# Patient Record
Sex: Male | Born: 1995 | Race: White | Hispanic: No | Marital: Single | Smoking: Never smoker
Health system: Southern US, Community
[De-identification: ages and names within clinical notes are randomized; demographics above are authoritative.]

## PROBLEM LIST (undated history)

## (undated) DIAGNOSIS — J45909 Unspecified asthma, uncomplicated: Secondary | ICD-10-CM

---
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
## 2014-09-21 ENCOUNTER — Encounter (HOSPITAL_COMMUNITY): Payer: Self-pay | Admitting: *Deleted

## 2014-09-21 ENCOUNTER — Emergency Department (HOSPITAL_COMMUNITY)
Admission: EM | Admit: 2014-09-21 | Discharge: 2014-09-21 | Disposition: A | Discharge status: Home / Self Care | Payer: 59 | Attending: Emergency Medicine | Admitting: Emergency Medicine

## 2014-09-21 ENCOUNTER — Emergency Department (HOSPITAL_COMMUNITY): Payer: 59

## 2014-09-21 DIAGNOSIS — Y9289 Other specified places as the place of occurrence of the external cause: Secondary | ICD-10-CM | POA: Insufficient documentation

## 2014-09-21 DIAGNOSIS — Y990 Civilian activity done for income or pay: Secondary | ICD-10-CM | POA: Diagnosis not present

## 2014-09-21 DIAGNOSIS — W51XXXA Accidental striking against or bumped into by another person, initial encounter: Secondary | ICD-10-CM | POA: Diagnosis not present

## 2014-09-21 DIAGNOSIS — S3991XA Unspecified injury of abdomen, initial encounter: Secondary | ICD-10-CM | POA: Diagnosis present

## 2014-09-21 DIAGNOSIS — R1033 Periumbilical pain: Secondary | ICD-10-CM

## 2014-09-21 DIAGNOSIS — Y9389 Activity, other specified: Secondary | ICD-10-CM | POA: Insufficient documentation

## 2014-09-21 DIAGNOSIS — L989 Disorder of the skin and subcutaneous tissue, unspecified: Secondary | ICD-10-CM | POA: Insufficient documentation

## 2014-09-21 LAB — I-STAT CHEM 8, ED
BUN: 11 mg/dL (ref 6–23)
CHLORIDE: 103 meq/L (ref 96–112)
CREATININE: 0.8 mg/dL (ref 0.50–1.35)
Calcium, Ion: 1.21 mmol/L (ref 1.12–1.23)
GLUCOSE: 92 mg/dL (ref 70–99)
HCT: 43 % (ref 39.0–52.0)
Hemoglobin: 14.6 g/dL (ref 13.0–17.0)
POTASSIUM: 4 meq/L (ref 3.7–5.3)
SODIUM: 139 meq/L (ref 137–147)
TCO2: 27 mmol/L (ref 0–100)

## 2014-09-21 MED ORDER — IOHEXOL 300 MG/ML  SOLN
100.0000 mL | Freq: Once | INTRAMUSCULAR | Status: AC | PRN
  Administered 2014-09-21: 100 mL via INTRAVENOUS

## 2014-09-21 MED ORDER — IBUPROFEN 800 MG PO TABS: 800.0000 mg | ORAL_TABLET | Freq: Three times a day (TID) | ORAL | Status: AC

## 2014-09-21 MED ORDER — SODIUM CHLORIDE 0.9 % IV SOLN
1000.0000 mL | Freq: Once | INTRAVENOUS | Status: AC
  Administered 2014-09-21: 1000 mL via INTRAVENOUS

## 2014-09-21 MED ORDER — IOHEXOL 300 MG/ML  SOLN
25.0000 mL | Freq: Once | INTRAMUSCULAR | Status: AC | PRN
  Administered 2014-09-21: 25 mL via ORAL

## 2014-09-21 MED ORDER — ONDANSETRON HCL 4 MG/2ML IJ SOLN: 4.0000 mg | Freq: Once | INTRAMUSCULAR | Status: DC
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
## 2014-09-21 NOTE — ED Notes (Signed)
Recheck vitals, pt waiting with family, no distress, states on has pain with walking

## 2014-09-21 NOTE — Discharge Instructions (Signed)
As discussed, your evaluation today has been largely reassuring.  But, it is important that you monitor your condition carefully, and do not hesitate to return to the ED if you develop new, or concerning changes in your condition. ? ?Otherwise, please follow-up with your physician for appropriate ongoing care. ? ?
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
## 2017-06-11 ENCOUNTER — Emergency Department (HOSPITAL_COMMUNITY)
Admission: EM | Admit: 2017-06-11 | Discharge: 2017-06-11 | Disposition: A | Discharge status: Home / Self Care | Payer: 59 | Attending: Emergency Medicine | Admitting: Emergency Medicine

## 2017-06-11 ENCOUNTER — Encounter (HOSPITAL_COMMUNITY): Payer: Self-pay | Admitting: Emergency Medicine

## 2017-06-11 DIAGNOSIS — Z79899 Other long term (current) drug therapy: Secondary | ICD-10-CM | POA: Insufficient documentation

## 2017-06-11 DIAGNOSIS — J039 Acute tonsillitis, unspecified: Secondary | ICD-10-CM | POA: Insufficient documentation

## 2017-06-11 MED ORDER — AMOXICILLIN 500 MG PO CAPS: 500.0000 mg | ORAL_CAPSULE | Freq: Three times a day (TID) | ORAL | 0 refills | Status: DC

## 2017-06-11 NOTE — Discharge Instructions (Signed)
Drink plenty of fluids.  Ibuprofen every 6 hrs for fever or pain.  Try taking OTC decongestant as directed.  Return if needed

## 2017-06-11 NOTE — ED Triage Notes (Signed)
Patient complaining of sore throat since yesterday.  

## 2017-06-11 NOTE — ED Provider Notes (Signed)
[facility]-EMERGENCY DEPT Provider Note   CSN: [id number] Arrival date & time: 06/11/17  1622     History   Chief Complaint Chief Complaint  Patient presents with  . Sore Throat    HPI Michael Kaufman is a 21 y.o. male.  HPI  Michael Kaufman is a 21 y.o. male who presents to the Emergency Department complaining of sore throat for one day.  He describes mild pain with swallowing and swelling to the right tonsil with white patches to both tonsils.  He has tried OTC cold medications and throat spray without relief.  Mild nasal congestion.  Denies fever, neck pain or stiffness, cough and ear pain.  No known sick contacts, but works with the public.     History reviewed. No pertinent past medical history.  There are no active problems to display for this patient.   History reviewed. No pertinent surgical history.     Home Medications    Prior to Admission medications   Medication Sig Start Date End Date Taking? Authorizing Provider  acetaminophen (TYLENOL) 500 MG tablet Take 500 mg by mouth every 6 (six) hours as needed for mild pain or moderate pain.    [provider]    Family History History reviewed. No pertinent family history.  Social History Social History  Substance Use Topics  . Smoking status: Never Smoker  . Smokeless tobacco: Never Used  . Alcohol use Yes     Comment: occasionally     Allergies   Coconut flavor   Review of Systems Review of Systems  Constitutional: Negative for activity change, appetite change, chills and fever.  HENT: Positive for congestion and sore throat. Negative for ear pain, facial swelling, trouble swallowing and voice change.   Eyes: Negative for pain and visual disturbance.  Respiratory: Negative for cough and shortness of breath.   Cardiovascular: Negative for chest pain.  Gastrointestinal: Negative for abdominal pain, nausea and vomiting.  Musculoskeletal: Negative for arthralgias, neck pain and neck stiffness.    Skin: Negative for color change and rash.  Neurological: Negative for dizziness, facial asymmetry, speech difficulty, numbness and headaches.  Hematological: Negative for adenopathy.  All other systems reviewed and are negative.    Physical Exam Updated Vital Signs BP 136/67 (BP Location: Right Arm)   Pulse 86   Temp 98.7 F (37.1 C) (Oral)   Resp 18   Ht 6' (1.829 m)   Wt 118.8 kg (262 lb)   SpO2 98%   BMI 35.53 kg/m   Physical Exam  Constitutional: He is oriented to person, place, and time. He appears well-developed and well-nourished. No distress.  HENT:  Head: Normocephalic and atraumatic.  Right Ear: Tympanic membrane and ear canal normal.  Left Ear: Tympanic membrane and ear canal normal.  Mouth/Throat: Uvula is midline and mucous membranes are normal. No trismus in the jaw. No uvula swelling. Posterior oropharyngeal edema and posterior oropharyngeal erythema present. No tonsillar abscesses.  Neck: Normal range of motion. Neck supple.  Cardiovascular: Normal rate, regular rhythm and normal heart sounds.   Pulmonary/Chest: Effort normal and breath sounds normal.  Abdominal: There is no splenomegaly. There is no tenderness.  Musculoskeletal: Normal range of motion.  Lymphadenopathy:    He has no cervical adenopathy.  Neurological: He is alert and oriented to person, place, and time. He exhibits normal muscle tone. Coordination normal.  Skin: Skin is warm and dry.  Nursing note and vitals reviewed.    ED Treatments / Results  Labs (all  labs ordered are listed, but only abnormal results are displayed) Labs Reviewed - No data to display  EKG  EKG Interpretation None       Radiology No results found.  Procedures Procedures (including critical care time)  Medications Ordered in ED Medications - No data to display   Initial Impression / Assessment and Plan / ED Course  I have reviewed the triage vital signs and the nursing notes.  Pertinent labs &  imaging results that were available during my care of the patient were reviewed by me and considered in my medical decision making (see chart for details).     Airway patent.  Bilateral erythema and exudative tonsils. No concerning sx's for PTA.  Appears stable for d/c, return precautions discussed.  Final Clinical Impressions(s) / ED Diagnoses   Final diagnoses:  Tonsillitis    New Prescriptions New Prescriptions   No medications on file     Rosey Bath 06/13/17 2329    Linwood Dibbles, MD 06/14/17 [phone]

## 2017-09-01 ENCOUNTER — Emergency Department (HOSPITAL_COMMUNITY)
Admission: EM | Admit: 2017-09-01 | Discharge: 2017-09-01 | Disposition: A | Discharge status: Home / Self Care | Payer: Self-pay | Attending: Emergency Medicine | Admitting: Emergency Medicine

## 2017-09-01 ENCOUNTER — Encounter (HOSPITAL_COMMUNITY): Payer: Self-pay

## 2017-09-01 DIAGNOSIS — J029 Acute pharyngitis, unspecified: Secondary | ICD-10-CM | POA: Insufficient documentation

## 2017-09-01 DIAGNOSIS — R05 Cough: Secondary | ICD-10-CM | POA: Insufficient documentation

## 2017-09-01 DIAGNOSIS — R0981 Nasal congestion: Secondary | ICD-10-CM | POA: Insufficient documentation

## 2017-09-01 DIAGNOSIS — J069 Acute upper respiratory infection, unspecified: Secondary | ICD-10-CM | POA: Insufficient documentation

## 2017-09-01 DIAGNOSIS — R51 Headache: Secondary | ICD-10-CM | POA: Insufficient documentation

## 2017-09-01 MED ORDER — AMOXICILLIN 500 MG PO CAPS: 500.0000 mg | ORAL_CAPSULE | Freq: Three times a day (TID) | ORAL | 0 refills | Status: DC

## 2017-09-01 MED ORDER — AMOXICILLIN 250 MG PO CAPS
500.0000 mg | ORAL_CAPSULE | Freq: Once | ORAL | Status: AC
  Administered 2017-09-01: 500 mg via ORAL
  Filled 2017-09-01: qty 2

## 2017-09-01 MED ORDER — PSEUDOEPHEDRINE HCL 60 MG PO TABS
60.0000 mg | ORAL_TABLET | Freq: Once | ORAL | Status: AC
  Administered 2017-09-01: 60 mg via ORAL
  Filled 2017-09-01: qty 1

## 2017-09-01 MED ORDER — IBUPROFEN 800 MG PO TABS
800.0000 mg | ORAL_TABLET | Freq: Once | ORAL | Status: AC
  Administered 2017-09-01: 800 mg via ORAL
  Filled 2017-09-01: qty 1

## 2017-09-01 NOTE — ED Provider Notes (Signed)
[facility]GENCY DEPARTMENT Provider Note   CSN: [id number] Arrival date & time: 09/01/17  0747     History   Chief Complaint Chief Complaint  Patient presents with  . Sore Throat    HPI Michael Kaufman is a 21 y.o. male.   URI   This is a new problem. The current episode started more than 2 days ago. The problem has been gradually worsening. There has been no fever. Associated symptoms include congestion, headaches, sore throat and cough. Pertinent negatives include no chest pain, no abdominal pain, no vomiting, no dysuria, no neck pain, no rash and no wheezing. Treatments tried: tylenol. The treatment provided mild relief.    History reviewed. No pertinent past medical history.  There are no active problems to display for this patient.   History reviewed. No pertinent surgical history.     Home Medications    Prior to Admission medications   Medication Sig Start Date End Date Taking? Authorizing Provider  acetaminophen (TYLENOL) 500 MG tablet Take 500 mg by mouth every 6 (six) hours as needed for mild pain or moderate pain.    [provider]  amoxicillin (AMOXIL) 500 MG capsule Take 1 capsule (500 mg total) by mouth 3 (three) times daily. 06/11/17   Pauline Ausriplett, Tammy, PA-C    Family History No family history on file.  Social History Social History   Tobacco Use  . Smoking status: Never Smoker  . Smokeless tobacco: Never Used  Substance Use Topics  . Alcohol use: Yes    Comment: occasionally  . Drug use: No     Allergies   Coconut flavor   Review of Systems Review of Systems  Constitutional: Positive for chills. Negative for activity change.       All ROS Neg except as noted in HPI  HENT: Positive for congestion and sore throat. Negative for nosebleeds.   Eyes: Negative for photophobia and discharge.  Respiratory: Positive for cough. Negative for shortness of breath and wheezing.   Cardiovascular: Negative for chest pain and  palpitations.  Gastrointestinal: Negative for abdominal pain, blood in stool and vomiting.  Genitourinary: Negative for dysuria, frequency and hematuria.  Musculoskeletal: Negative for arthralgias, back pain and neck pain.  Skin: Negative.  Negative for rash.  Neurological: Positive for headaches. Negative for dizziness, seizures and speech difficulty.  Psychiatric/Behavioral: Negative for confusion and hallucinations.     Physical Exam Updated Vital Signs BP 127/77 (BP Location: Left Arm)   Pulse 66   Temp 98.1 F (36.7 C) (Oral)   Resp 18   Ht 6\' 3"  (1.905 m)   Wt 121.6 kg (268 lb)   SpO2 97%   BMI 33.50 kg/m   Physical Exam  Constitutional: He is oriented to person, place, and time. He appears well-developed and well-nourished.  Non-toxic appearance.  HENT:  Head: Normocephalic.  Right Ear: Tympanic membrane and external ear normal.  Left Ear: Tympanic membrane and external ear normal.  Mouth/Throat: Uvula is midline and mucous membranes are normal. Uvula swelling present. Oropharyngeal exudate and posterior oropharyngeal erythema present. No tonsillar abscesses.  Eyes: EOM and lids are normal. Pupils are equal, round, and reactive to light.  Neck: Normal range of motion. Neck supple. Carotid bruit is not present.  Cardiovascular: Normal rate, regular rhythm, normal heart sounds, intact distal pulses and normal pulses.  Pulmonary/Chest: Breath sounds normal. No respiratory distress.  Abdominal: Soft. Bowel sounds are normal. There is no tenderness. There is no guarding.  Musculoskeletal: Normal range  of motion.  Lymphadenopathy:       Head (right side): No submandibular adenopathy present.       Head (left side): No submandibular adenopathy present.    He has no cervical adenopathy.  Neurological: He is alert and oriented to person, place, and time. He has normal strength. No cranial nerve deficit or sensory deficit.  Skin: Skin is warm and dry. No rash noted.    Psychiatric: He has a normal mood and affect. His speech is normal.  Nursing note and vitals reviewed.    ED Treatments / Results  Labs (all labs ordered are listed, but only abnormal results are displayed) Labs Reviewed - No data to display  EKG  EKG Interpretation None       Radiology No results found.  Procedures Procedures (including critical care time)  Medications Ordered in ED Medications  ibuprofen (ADVIL,MOTRIN) tablet 800 mg (not administered)  amoxicillin (AMOXIL) capsule 500 mg (not administered)  pseudoephedrine (SUDAFED) tablet 60 mg (not administered)     Initial Impression / Assessment and Plan / ED Course  I have reviewed the triage vital signs and the nursing notes.  Pertinent labs & imaging results that were available during my care of the patient were reviewed by me and considered in my medical decision making (see chart for details).       Final Clinical Impressions(s) / ED Diagnoses MDM Vital signs within normal limits.  The patient complains of sore throat.  He is able to manage secretions, he is also able to swallow, but with some discomfort.  No evidence of any tonsillar abscess.  Airway is patent.  Speech is understandable.  The patient will be asked to use salt water gargles, use of Amoxil 3 times daily, and use Tylenol and/or ibuprofen for pain or fever.  Patient will follow up with his primary physician or return to the emergency department if any changes or problems.   Final diagnoses:  Pharyngitis, unspecified etiology  Upper respiratory tract infection, unspecified type    ED Discharge Orders        Ordered    amoxicillin (AMOXIL) 500 MG capsule  3 times daily     09/01/17 0840       Ivery QualeBryant, Tirso Laws, PA-C 09/01/17 [id number]    Donnetta Hutchingook, Brian, MD 09/02/17 2056

## 2017-09-01 NOTE — ED Triage Notes (Signed)
Pt co sore throat and intermittent fever x 2 days.

## 2017-09-01 NOTE — Discharge Instructions (Signed)
Please wash hands frequently.  Please increase fluids.  Salt water gargles may be helpful.  Use Amoxil 3 times daily with food.  Use Tylenol every 4 hours, or 600 mg of ibuprofen every 6 hours for fever or aching.  Please use your mask until symptoms have resolved.

## 2018-05-15 ENCOUNTER — Ambulatory Visit (HOSPITAL_COMMUNITY)
Admission: RE | Admit: 2018-05-15 | Discharge: 2018-05-15 | Disposition: A | Discharge status: Home / Self Care | Payer: BLUE CROSS/BLUE SHIELD | Source: Ambulatory Visit | Attending: Internal Medicine | Admitting: Internal Medicine

## 2018-05-15 ENCOUNTER — Other Ambulatory Visit (HOSPITAL_COMMUNITY): Payer: Self-pay | Admitting: Internal Medicine

## 2018-05-15 DIAGNOSIS — B9789 Other viral agents as the cause of diseases classified elsewhere: Secondary | ICD-10-CM

## 2018-05-15 DIAGNOSIS — J028 Acute pharyngitis due to other specified organisms: Secondary | ICD-10-CM

## 2018-05-15 DIAGNOSIS — J029 Acute pharyngitis, unspecified: Secondary | ICD-10-CM | POA: Insufficient documentation

## 2018-05-15 DIAGNOSIS — J351 Hypertrophy of tonsils: Secondary | ICD-10-CM | POA: Diagnosis not present

## 2019-03-04 IMAGING — DX DG NECK SOFT TISSUE
2 series · 2 of 2 positions shown · non-contrast
Comparison: None.

CLINICAL DATA: Sore throat

EXAM:
NECK SOFT TISSUES - 1+ VIEW

[neck lat]
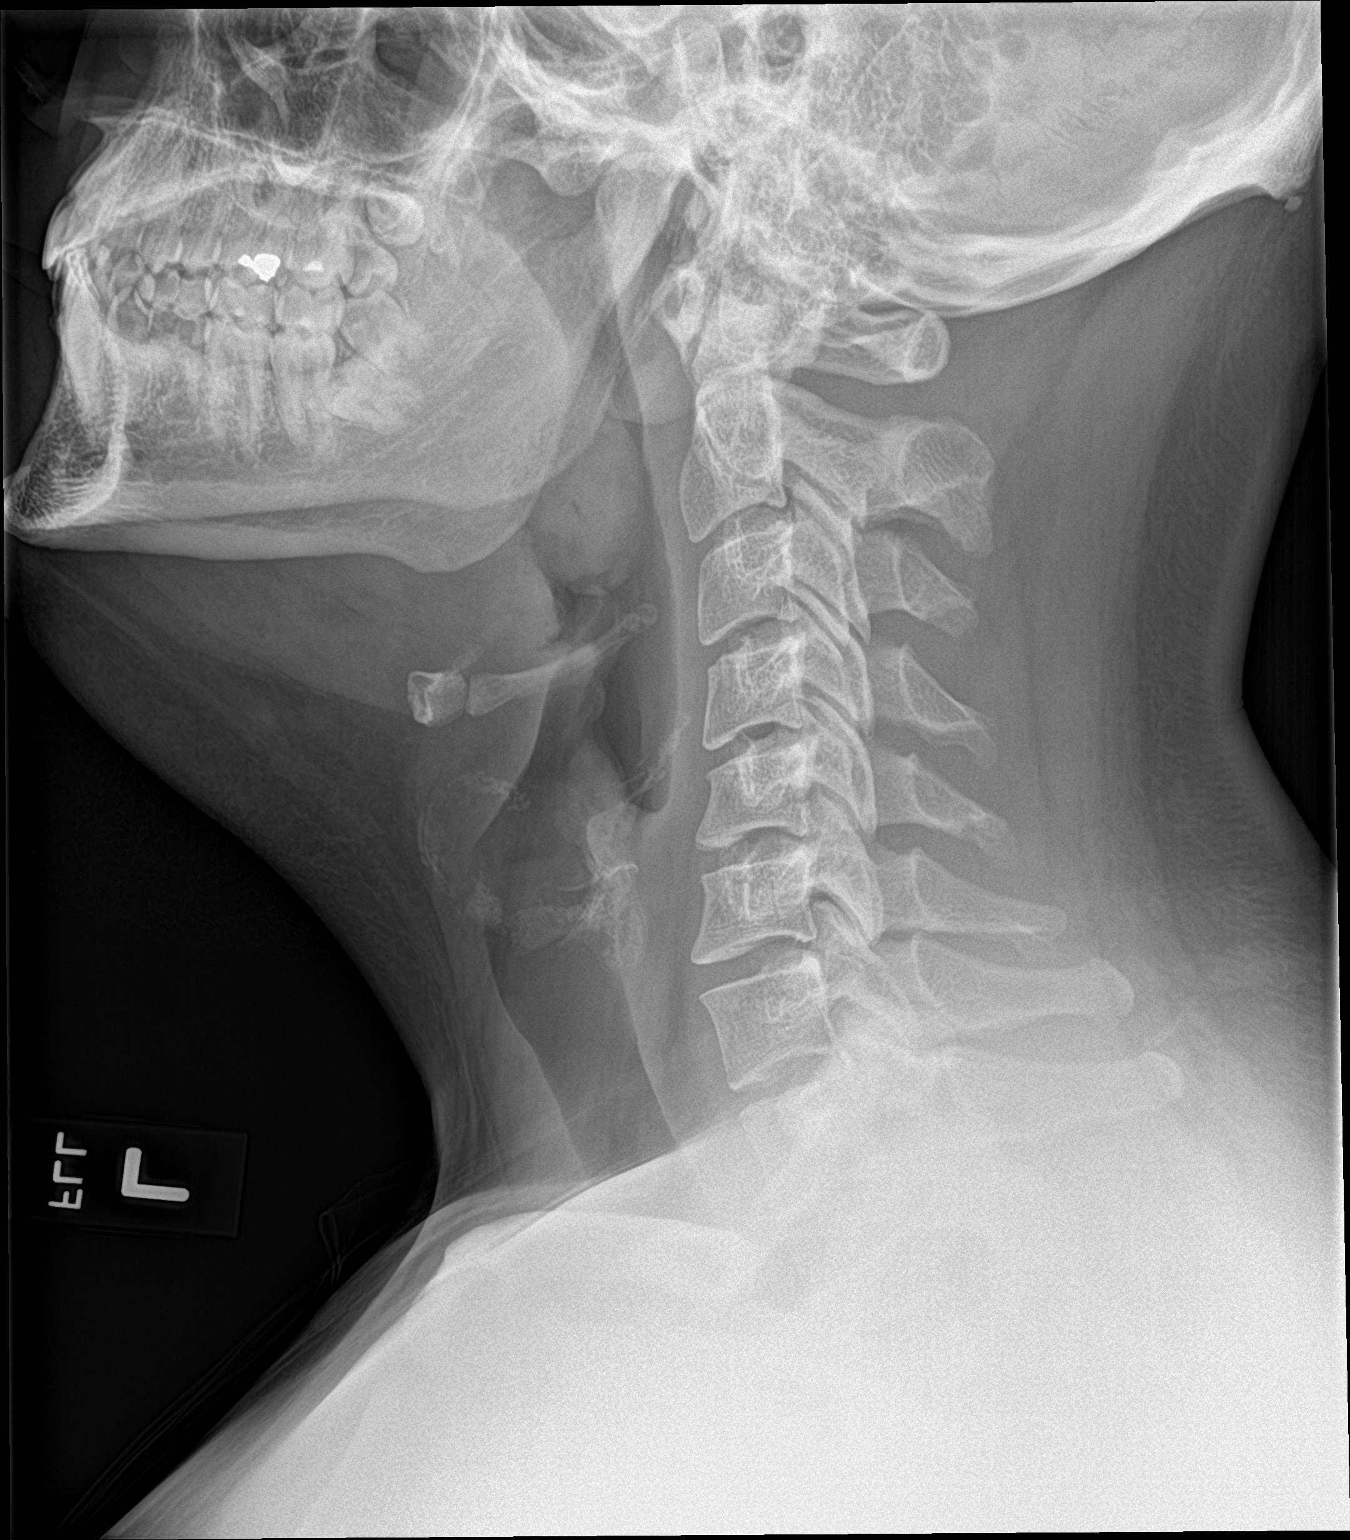

[neck ap]
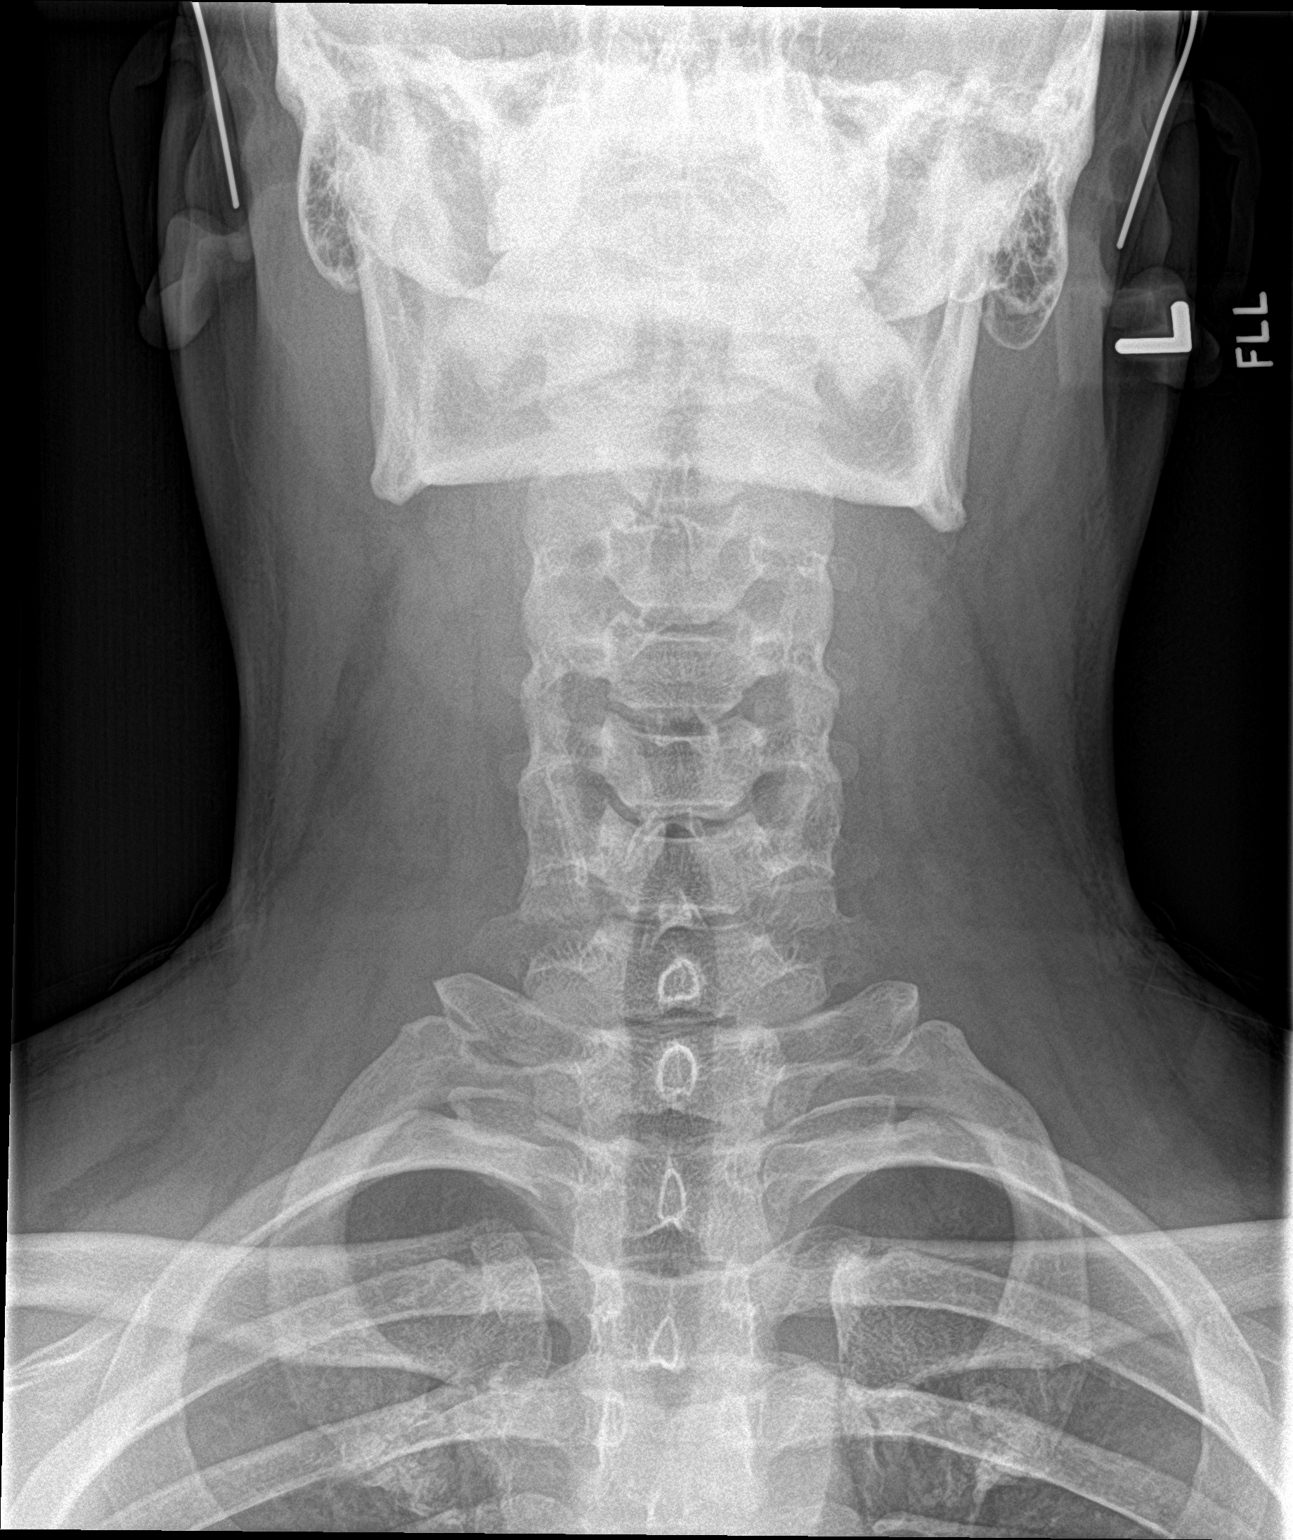

[2 of 2 positions shown; findings below may reference images not displayed]

FINDINGS: Frontal and lateral views obtained. The epiglottis and aryepiglottic
folds appear within normal limits. There is enlargement of the
lingual tonsils with impression on the pharyngeal air column in this
area. The adenoids appear normal.

There is no air-fluid level to suggest abscess. The prevertebral
soft tissues are normal. No evident bone lesion. Cervical trachea
appears unremarkable. Lung apices are clear.
IMPRESSION: Diffuse enlargement of the lingual tonsils.  Was unremarkable.

## 2019-08-17 NOTE — Progress Notes (Signed)
[facility] [place], [place] [address] Phone: [phone] Subjective:   Michael Kaufman, am serving as a scribe for Dr. Hulan Saas.   CC: Right shoulder pain follow-up  VPX:[id number]  Michael Kaufman is a 23 y.o. male coming in with complaint of right shoulder pain. Five years ago patient was holding pallet overhead. Twisted shoulder as he dropped pallet. Feels like his shoulder is unstable. Feels like he has had multiple other dislocations since injury 5 years ago. Unable to throw baseball. Has numbness in right shoulder but it does not radiate. Patient does not use anything for pain. 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      Kaufman past medical history on file. Kaufman past surgical history on file. Social History   Socioeconomic History  . Marital status: Single    Spouse name: Not on file  . Number of children: Not on file  . Years of education: Not on file  . Highest education level: Not on file  Occupational History  . Not on file  Social Needs  . Financial resource strain: Not on file  . Food insecurity    Worry: Not on file    Inability: Not on file  . Transportation needs    Medical: Not on file    Non-medical: Not on file  Tobacco Use  . Smoking status: Never Smoker  . Smokeless tobacco: Never Used  Substance and Sexual Activity  . Alcohol use: Yes    Comment: occasionally  . Drug use: Kaufman  . Sexual activity: Not on file  Lifestyle  . Physical activity    Days per week: Not on file    Minutes per session: Not on file  . Stress: Not on file  Relationships  . Social Herbalist on phone: Not on file    Gets together: Not on file    Attends religious service: Not on file    Active member of club or organization: Not on file    Attends meetings of clubs or organizations: Not on file    Relationship status: Not on file  Other Topics Concern  . Not on file  Social History  Narrative  . Not on file   Allergies  Allergen Reactions  . Coconut Flavor Swelling and Rash    Mouth swelling   Kaufman family history on file.     Current Outpatient Medications (Analgesics):  .  acetaminophen (TYLENOL) 500 MG tablet, Take 500 mg by mouth every 6 (six) hours as needed for mild pain or moderate pain.   Current Outpatient Medications (Other):  .  amoxicillin (AMOXIL) 500 MG capsule, Take 1 capsule (500 mg total) 3 (three) times daily by mouth. .  Diclofenac Sodium (PENNSAID) 2 % SOLN, Place 2 g onto the skin 2 (two) times daily. .  Vitamin D, Ergocalciferol, (DRISDOL) 1.25 MG (50000 UT) CAPS capsule, Take 1 capsule (50,000 Units total) by mouth every 7 (seven) days.    Past medical history, social, surgical and family history all reviewed in electronic medical record.  Kaufman pertanent information unless stated regarding to the chief complaint.   Review of Systems:  Kaufman headache, visual changes, nausea, vomiting, diarrhea, constipation, dizziness, abdominal pain, skin rash, fevers, chills, night sweats, weight loss, swollen lymph nodes, body aches, joint swelling,  chest pain, shortness of breath, mood changes.  Positive muscle aches  Objective  Blood pressure 118/72,  pulse 60, height 6\' 3"  (1.905 m), weight 249 lb (112.9 kg), SpO2 98 %.    General: Kaufman apparent distress alert and oriented x3 mood and affect normal, dressed appropriately.  HEENT: Pupils equal, extraocular movements intact  Respiratory: Patient's speak in full sentences and does not appear short of breath  Cardiovascular: Kaufman lower extremity edema, non tender, Kaufman erythema  Skin: Warm dry intact with Kaufman signs of infection or rash on extremities or on axial skeleton.  Abdomen: Soft nontender  Neuro: Cranial nerves II through XII are intact, neurovascularly intact in all extremities with 2+ DTRs and 2+ pulses.  Lymph: Kaufman lymphadenopathy of posterior or anterior cervical chain or axillae bilaterally.  Gait  normal with good balance and coordination.  MSK:  Non tender with full range of motion and good stability and symmetric strength and tone of  elbows, wrist, hip, knee and ankles bilaterally.  Shoulder: Right Inspection reveals Kaufman abnormalities, atrophy or asymmetry. Palpation is normal with Kaufman tenderness over AC joint or bicipital groove. ROM is full in all planes passively.  Patient does have some laxity with positive apprehension. Rotator cuff strength normal throughout. signs of impingement with positive Neer and Hawkin's tests, but negative empty can sign. Speeds and Yergason's tests normal. Kaufman labral pathology noted with negative Obrien's, positive clunk and instability noted.  Normal scapular function observed.. Kaufman apprehension sign  MSK performed of: Right This study was ordered, performed, and interpreted by [place] D.O.  Shoulder:   Supraspinatus:  Appears normal on long and transverse views, Subscapularis:  Appears normal on long and transverse views. AC joint:  Capsule undistended, Kaufman geyser sign. Glenohumeral Joint:  Appears normal without effusion. Glenoid Labrum:  Intact without visualized tears. Biceps Tendon:  Appears normal on long and transverse views, Kaufman fraying of tendon, tendon located in intertubercular groove, Kaufman subluxation with shoulder internal or external rotation.  Impression: Normal  Terrilee Files; 15 additional minutes spent for Therapeutic exercises as stated in above notes.  This included exercises focusing on stretching, strengthening, with significant focus on eccentric aspects.   Long term goals include an improvement in range of motion, strength, endurance as well as avoiding reinjury. Patient's frequency would include in 1-2 times a day, 3-5 times a week for a duration of 6-12 weeks. Shoulder Exercises that included:  Basic scapular stabilization to include adduction and depression of scapula Scaption, focusing on proper movement and good control  Internal and External rotation utilizing a theraband, with elbow tucked at side entire time Rows with theraband  Which was given    Proper technique shown and discussed handout in great detail with ATC.  All questions were discussed and answered.     Impression and Recommendations:     This case required medical decision making of moderate complexity. The above documentation has been reviewed and is accurate and complete [id number], DO       Note: This dictation was prepared with Dragon dictation along with smaller phrase technology. Any transcriptional errors that result from this process are unintentional.

## 2019-08-18 ENCOUNTER — Ambulatory Visit: Payer: Self-pay

## 2019-08-18 ENCOUNTER — Encounter: Payer: Self-pay | Admitting: Family Medicine

## 2019-08-18 ENCOUNTER — Ambulatory Visit: Payer: BLUE CROSS/BLUE SHIELD | Admitting: Family Medicine

## 2019-08-18 ENCOUNTER — Other Ambulatory Visit: Payer: Self-pay

## 2019-08-18 VITALS — BP 118/72 | HR 60 | Ht 75.0 in | Wt 249.0 lb

## 2019-08-18 DIAGNOSIS — M25511 Pain in right shoulder: Secondary | ICD-10-CM

## 2019-08-18 DIAGNOSIS — M25311 Other instability, right shoulder: Secondary | ICD-10-CM | POA: Diagnosis not present

## 2019-08-18 MED ORDER — VITAMIN D (ERGOCALCIFEROL) 1.25 MG (50000 UNIT) PO CAPS: 50000.0000 [IU] | ORAL_CAPSULE | ORAL | 0 refills | Status: DC

## 2019-08-18 MED ORDER — PENNSAID 2 % TD SOLN: 2.0000 g | Freq: Two times a day (BID) | TRANSDERMAL | 3 refills | Status: DC

## 2019-08-18 NOTE — Patient Instructions (Signed)
Xray downstairs Exercise 3 times a week See me again in 6-8 weeks

## 2019-08-19 ENCOUNTER — Encounter: Payer: Self-pay | Admitting: Family Medicine

## 2019-08-19 DIAGNOSIS — M25311 Other instability, right shoulder: Secondary | ICD-10-CM | POA: Insufficient documentation

## 2019-08-19 NOTE — Assessment & Plan Note (Signed)
Multidirectional instability of the shoulder.  We discussed with patient's age that he may be a surgical candidate.  Patient wants to try conservative therapy.  Home exercises for stability given.  Discussed over-the-counter medications, discussed proper lifting mechanics, x-rays ordered today to further evaluate but ultrasound today showed that rotator cuff is intact.  Follow-up with me again in 4 to 6 weeks

## 2019-08-20 ENCOUNTER — Other Ambulatory Visit: Payer: Self-pay | Admitting: General Practice

## 2019-08-20 NOTE — Telephone Encounter (Signed)
Requested medication (s) are due for refill today: no  Requested medication (s) are on the active medication list: yes  Last refill:  09/09/2017  Future visit scheduled: yes  Notes to clinic:  Medication last filled by historical provider Diclofenac sodium sent to wrong pharmacy   Requested Prescriptions  Pending Prescriptions Disp Refills   amoxicillin (AMOXIL) 500 MG capsule 21 capsule 0    Sig: Take 1 capsule (500 mg total) by mouth 3 (three) times daily.     Off-Protocol Failed - 08/20/2019  1:08 PM      Failed - Medication not assigned to a protocol, review manually.      Passed - Valid encounter within last 12 months    Recent Outpatient Visits          2 days ago Right shoulder pain, unspecified chronicity   [place], [place], DO      Future Appointments            In 1 month [place], [facility]            Diclofenac Sodium (PENNSAID) 2 % SOLN 112 g 3    Sig: Place 2 g onto the skin 2 (two) times daily.     Analgesics:  Topicals Passed - 08/20/2019  1:08 PM      Passed - Valid encounter within last 12 months    Recent Outpatient Visits          2 days ago Right shoulder pain, unspecified chronicity   [place], [place], DO      Future Appointments            In 1 month Tamala Julian Olevia [place], [place], [facility]

## 2019-08-20 NOTE — Telephone Encounter (Signed)
Patient is a patient of

## 2019-08-20 NOTE — Telephone Encounter (Signed)
Routing to Sports Med. °

## 2019-08-20 NOTE — Telephone Encounter (Signed)
Medication Refill - Medication: amoxicillin (AMOXIL) 500 MG capsule [[id number]]  Diclofenac Sodium (PENNSAID) 2 % SOLN [[id number]]      Preferred Pharmacy (with phone number or street name):  [place], [place] - [address] [place] [id number]  [address] [place] [address]  Phone: [phone] Fax: [phone]     Agent: Please be advised that RX refills may take up to 3 business days. We ask that you follow-up with your pharmacy.

## 2019-09-04 DIAGNOSIS — E6609 Other obesity due to excess calories: Secondary | ICD-10-CM | POA: Diagnosis not present

## 2019-09-04 DIAGNOSIS — K146 Glossodynia: Secondary | ICD-10-CM | POA: Diagnosis not present

## 2019-09-04 DIAGNOSIS — Z6832 Body mass index (BMI) 32.0-32.9, adult: Secondary | ICD-10-CM | POA: Diagnosis not present

## 2019-09-16 DIAGNOSIS — E6609 Other obesity due to excess calories: Secondary | ICD-10-CM | POA: Diagnosis not present

## 2019-09-16 DIAGNOSIS — Z6832 Body mass index (BMI) 32.0-32.9, adult: Secondary | ICD-10-CM | POA: Diagnosis not present

## 2019-09-16 DIAGNOSIS — R109 Unspecified abdominal pain: Secondary | ICD-10-CM | POA: Diagnosis not present

## 2019-09-16 DIAGNOSIS — R1012 Left upper quadrant pain: Secondary | ICD-10-CM | POA: Diagnosis not present

## 2019-10-01 ENCOUNTER — Ambulatory Visit: Payer: BC Managed Care – PPO | Admitting: Family Medicine

## 2019-10-01 NOTE — Progress Notes (Deleted)
[name] [address] [place], [place] [address] Phone: [phone] Subjective:    I'm seeing this patient by the request  of:    CC:   [id number]   08/18/2019 Multidirectional instability of the shoulder.  We discussed with patient's age that he may be a surgical candidate.  Patient wants to try conservative therapy.  Home exercises for stability given.  Discussed over-the-counter medications, discussed proper lifting mechanics, x-rays ordered today to further evaluate but ultrasound today showed that rotator cuff is intact.  Follow-up with me again in 4 to 6 weeks  Update 10/01/2019 Michael Kaufman is a 23 y.o. male coming in with complaint of right shoulder pain. Patient states      No past medical history on file. No past surgical history on file. Social History   Socioeconomic History  . Marital status: Single    Spouse name: Not on file  . Number of children: Not on file  . Years of education: Not on file  . Highest education level: Not on file  Occupational History  . Not on file  Social Needs  . Financial resource strain: Not on file  . Food insecurity    Worry: Not on file    Inability: Not on file  . Transportation needs    Medical: Not on file    Non-medical: Not on file  Tobacco Use  . Smoking status: Never Smoker  . Smokeless tobacco: Never Used  Substance and Sexual Activity  . Alcohol use: Yes    Comment: occasionally  . Drug use: No  . Sexual activity: Not on file  Lifestyle  . Physical activity    Days per week: Not on file    Minutes per session: Not on file  . Stress: Not on file  Relationships  . Social Musician on phone: Not on file    Gets together: Not on file    Attends religious service: Not on file    Active member of club or organization: Not on file    Attends meetings of clubs or organizations: Not on file    Relationship status: Not on file  Other Topics Concern  . Not on file   Social History Narrative  . Not on file   Allergies  Allergen Reactions  . Coconut Flavor Swelling and Rash    Mouth swelling   No family history on file.     Current Outpatient Medications (Analgesics):  .  acetaminophen (TYLENOL) 500 MG tablet, Take 500 mg by mouth every 6 (six) hours as needed for mild pain or moderate pain.   Current Outpatient Medications (Other):  .  amoxicillin (AMOXIL) 500 MG capsule, Take 1 capsule (500 mg total) 3 (three) times daily by mouth. .  Diclofenac Sodium (PENNSAID) 2 % SOLN, Place 2 g onto the skin 2 (two) times daily. .  Vitamin D, Ergocalciferol, (DRISDOL) 1.25 MG (50000 UT) CAPS capsule, Take 1 capsule (50,000 Units total) by mouth every 7 (seven) days.    Past medical history, social, surgical and family history all reviewed in electronic medical record.  No pertanent information unless stated regarding to the chief complaint.   Review of Systems:  No headache, visual changes, nausea, vomiting, diarrhea, constipation, dizziness, abdominal pain, skin rash, fevers, chills, night sweats, weight loss, swollen lymph nodes, body aches, joint swelling, muscle aches, chest pain, shortness of breath, mood changes.   Objective  There were no vitals taken for this  visit. Systems examined below as of    General: No apparent distress alert and oriented x3 mood and affect normal, dressed appropriately.  HEENT: Pupils equal, extraocular movements intact  Respiratory: Patient's speak in full sentences and does not appear short of breath  Cardiovascular: No lower extremity edema, non tender, no erythema  Skin: Warm dry intact with no signs of infection or rash on extremities or on axial skeleton.  Abdomen: Soft nontender  Neuro: Cranial nerves II through XII are intact, neurovascularly intact in all extremities with 2+ DTRs and 2+ pulses.  Lymph: No lymphadenopathy of posterior or anterior cervical chain or axillae bilaterally.  Gait normal with good  balance and coordination.  MSK:  Non tender with full range of motion and good stability and symmetric strength and tone of shoulders, elbows, wrist, hip, knee and ankles bilaterally.     Impression and Recommendations:     This case required medical decision making of moderate complexity. The above documentation has been reviewed and is accurate and complete Jacqualin Combes       Note: This dictation was prepared with Dragon dictation along with smaller phrase technology. Any transcriptional errors that result from this process are unintentional.

## 2019-11-29 ENCOUNTER — Other Ambulatory Visit: Payer: Self-pay

## 2019-11-29 ENCOUNTER — Ambulatory Visit
Admission: EM | Admit: 2019-11-29 | Discharge: 2019-11-29 | Disposition: A | Discharge status: Home / Self Care | Payer: BC Managed Care – PPO | Attending: Emergency Medicine | Admitting: Emergency Medicine

## 2019-11-29 DIAGNOSIS — N200 Calculus of kidney: Secondary | ICD-10-CM

## 2019-11-29 DIAGNOSIS — R1031 Right lower quadrant pain: Secondary | ICD-10-CM

## 2019-11-29 DIAGNOSIS — R109 Unspecified abdominal pain: Secondary | ICD-10-CM | POA: Diagnosis not present

## 2019-11-29 DIAGNOSIS — R10A1 Flank pain, right side: Secondary | ICD-10-CM

## 2019-11-29 MED ORDER — IBUPROFEN 800 MG PO TABS: 800.0000 mg | ORAL_TABLET | Freq: Three times a day (TID) | ORAL | 0 refills | Status: DC

## 2019-11-29 NOTE — ED Triage Notes (Signed)
Pt has right sided flank and right side abdominal pain and back pain that began 2 days ago

## 2019-11-29 NOTE — Discharge Instructions (Signed)
Unable to produce urine sample Will treat for possible kidney stones, however, you may have already passed it Push fluids and get plenty of rest.   Ibuprofen 800 mg prescribed.  Take as needed for pain Follow up with PCP as needed Return here or go to ER if you have any new or worsening symptoms such as fever, worsening abdominal pain, nausea/vomiting, flank pain, etc..Marland Kitchen

## 2019-11-29 NOTE — ED Provider Notes (Signed)
[facility]   CC: RT sided flank pain  SUBJECTIVE:  Michael Kaufman is a 24 y.o. male who complains of mostly resolved RLQ and flank discomfort for the past 2 days.  States he urinated yesterday and had what looked like "sand" in his urine.  Since then his symptoms have improved. Patient denies a precipitating event, or injury.  Does have a strenuous job, but denies known injury.  Denies concern for STDs.  Currently engaged and will be getting married in April of this year.  Localizes the discomfort to the RLQ and radiates into RT flank.  Pain is intermittent, and unable to characterize.  Has NOT tried OTC medications.  Symptoms are made worse to deep palpation.  Denies similar symptoms in the past. Does report dark urine yesterday, resolved today. Denies fever, chills, nausea, vomiting, chest pain, SOB, urethral discharge, testicular pain, testicular swelling.    LMP: No LMP for male patient.  ROS: As in HPI.  All other pertinent ROS negative.     History reviewed. No pertinent past medical history. History reviewed. No pertinent surgical history. No Known Allergies No current facility-administered medications on file prior to encounter.   No current outpatient medications on file prior to encounter.   Social History   Socioeconomic History  . Marital status: Single    Spouse name: Not on file  . Number of children: Not on file  . Years of education: Not on file  . Highest education level: Not on file  Occupational History  . Not on file  Tobacco Use  . Smoking status: Never Smoker  . Smokeless tobacco: Never Used  Substance and Sexual Activity  . Alcohol use: Yes    Comment: occasionally  . Drug use: No  . Sexual activity: Not on file  Other Topics Concern  . Not on file  Social History Narrative  . Not on file   Social Determinants of Health   Financial Resource Strain:   . Difficulty of Paying Living Expenses: Not on file  Food Insecurity:   . Worried  About Programme researcher, broadcasting/film/video in the Last Year: Not on file  . Ran Out of Food in the Last Year: Not on file  Transportation Needs:   . Lack of Transportation (Medical): Not on file  . Lack of Transportation (Non-Medical): Not on file  Physical Activity:   . Days of Exercise per Week: Not on file  . Minutes of Exercise per Session: Not on file  Stress:   . Feeling of Stress : Not on file  Social Connections:   . Frequency of Communication with Friends and Family: Not on file  . Frequency of Social Gatherings with Friends and Family: Not on file  . Attends Religious Services: Not on file  . Active Member of Clubs or Organizations: Not on file  . Attends Banker Meetings: Not on file  . Marital Status: Not on file  Intimate Partner Violence:   . Fear of Current or Ex-Partner: Not on file  . Emotionally Abused: Not on file  . Physically Abused: Not on file  . Sexually Abused: Not on file   Family History  Problem Relation Age of Onset  . Heart disease Mother   . Cancer Mother     OBJECTIVE:  Vitals:   11/29/19 0958  BP: 117/79  Pulse: (!) 55  Resp: 18  Temp: 98.1 F (36.7 C)  SpO2: 97%   General appearance: Alert in no acute distress HEENT:  NCAT.  PERRL, EOMI grossly; Oropharynx clear.  Lungs: clear to auscultation bilaterally without adventitious breath sounds Heart: regular rate and rhythm.   Abdomen: soft; non-distended; diffusely TTP over RLQ with deep palpation, difficult to reproduce symptoms on exam; bowel sounds present; no guarding or rebound tenderness Back: no CVA tenderness; no midline tenderness Extremities: no edema; symmetrical with no gross deformities Skin: warm and dry Neurologic: Ambulates from chair to exam table without difficulty Psychological: alert and cooperative; normal mood and affect  ASSESSMENT & PLAN:  1. Right flank discomfort   2. RLQ discomfort   3. Kidney stones     Meds ordered this encounter  Medications  .  ibuprofen (ADVIL) 800 MG tablet    Sig: Take 1 tablet (800 mg total) by mouth 3 (three) times daily.    Dispense:  30 tablet    Refill:  0    Order Specific Question:   Supervising Provider    Answer:   Raylene Everts [[id number]]   Unable to produce urine sample Will treat for possible kidney stones, however, you may have already passed it Push fluids and get plenty of rest.   Ibuprofen 800 mg prescribed.  Take as needed for pain Follow up with PCP as needed Return here or go to ER if you have any new or worsening symptoms such as fever, worsening abdominal pain, nausea/vomiting, flank pain, etc...  Outlined signs and symptoms indicating need for more acute intervention. Patient verbalized understanding. After Visit Summary given.     Lestine Box, PA-C 11/29/19 1036

## 2020-02-09 ENCOUNTER — Other Ambulatory Visit: Payer: Self-pay

## 2020-02-09 ENCOUNTER — Ambulatory Visit: Payer: BC Managed Care – PPO | Attending: Internal Medicine

## 2020-02-09 DIAGNOSIS — Z20822 Contact with and (suspected) exposure to covid-19: Secondary | ICD-10-CM | POA: Diagnosis not present

## 2020-02-10 LAB — NOVEL CORONAVIRUS, NAA: SARS-CoV-2, NAA: NOT DETECTED

## 2020-02-10 LAB — SARS-COV-2, NAA 2 DAY TAT

## 2020-02-11 DIAGNOSIS — R509 Fever, unspecified: Secondary | ICD-10-CM | POA: Diagnosis not present

## 2020-02-12 ENCOUNTER — Encounter (HOSPITAL_COMMUNITY): Payer: Self-pay

## 2020-02-12 ENCOUNTER — Emergency Department (HOSPITAL_COMMUNITY)
Admission: EM | Admit: 2020-02-12 | Discharge: 2020-02-12 | Disposition: A | Discharge status: Home / Self Care | Payer: BC Managed Care – PPO | Attending: Emergency Medicine | Admitting: Emergency Medicine

## 2020-02-12 ENCOUNTER — Other Ambulatory Visit: Payer: Self-pay

## 2020-02-12 DIAGNOSIS — J45909 Unspecified asthma, uncomplicated: Secondary | ICD-10-CM | POA: Insufficient documentation

## 2020-02-12 DIAGNOSIS — K529 Noninfective gastroenteritis and colitis, unspecified: Secondary | ICD-10-CM | POA: Insufficient documentation

## 2020-02-12 DIAGNOSIS — R1033 Periumbilical pain: Secondary | ICD-10-CM | POA: Diagnosis not present

## 2020-02-12 HISTORY — DX: Unspecified asthma, uncomplicated: J45.909

## 2020-02-12 LAB — CBC
HCT: 41.4 % (ref 39.0–52.0)
Hemoglobin: 13.7 g/dL (ref 13.0–17.0)
MCH: 29.2 pg (ref 26.0–34.0)
MCHC: 33.1 g/dL (ref 30.0–36.0)
MCV: 88.3 fL (ref 80.0–100.0)
Platelets: 202 10*3/uL (ref 150–400)
RBC: 4.69 MIL/uL (ref 4.22–5.81)
RDW: 11.9 % (ref 11.5–15.5)
WBC: 7.7 10*3/uL (ref 4.0–10.5)
nRBC: 0 % (ref 0.0–0.2)

## 2020-02-12 LAB — COMPREHENSIVE METABOLIC PANEL
ALT: 25 U/L (ref 0–44)
AST: 20 U/L (ref 15–41)
Albumin: 4.1 g/dL (ref 3.5–5.0)
Alkaline Phosphatase: 48 U/L (ref 38–126)
Anion gap: 10 (ref 5–15)
BUN: 14 mg/dL (ref 6–20)
CO2: 27 mmol/L (ref 22–32)
Calcium: 8.9 mg/dL (ref 8.9–10.3)
Chloride: 102 mmol/L (ref 98–111)
Creatinine, Ser: 1 mg/dL (ref 0.61–1.24)
GFR calc Af Amer: >60 mL/min (ref >60)
GFR calc non Af Amer: >60 mL/min (ref >60)
Glucose, Bld: 124 mg/dL — ABNORMAL HIGH (ref 70–99)
Potassium: 3.6 mmol/L (ref 3.5–5.1)
Sodium: 139 mmol/L (ref 135–145)
Total Bilirubin: 0.5 mg/dL (ref 0.3–1.2)
Total Protein: 7.6 g/dL (ref 6.5–8.1)

## 2020-02-12 LAB — LIPASE, BLOOD: Lipase: 19 U/L (ref 11–51)

## 2020-02-12 MED ORDER — DIPHENOXYLATE-ATROPINE 2.5-0.025 MG PO TABS
2.0000 | ORAL_TABLET | Freq: Once | ORAL | Status: AC
  Administered 2020-02-12: 2 via ORAL
  Filled 2020-02-12: qty 2

## 2020-02-12 MED ORDER — DIPHENOXYLATE-ATROPINE 2.5-0.025 MG PO TABS: 1.0000 | ORAL_TABLET | Freq: Four times a day (QID) | ORAL | 0 refills | Status: DC | PRN

## 2020-02-12 NOTE — ED Triage Notes (Signed)
Pt c.o fever highest 101 at home starting Monday. covid negative. Wednesday complains of abdominal pain . PCP stated that its his appendix. 99.8 at home . States he has had constant diarrhea x 2 days. States his pain is right above his belly button

## 2020-02-12 NOTE — Discharge Instructions (Signed)
Make sure you are drinking plenty of fluids to avoid dehydration.  You are not dehydrated today based on your lab tests.  You may continue to take the Lomotil that has been prescribed.  However only take this medicine if you continue to have diarrhea, it may make you significantly constipated if you continue to take it once your symptoms have resolved.

## 2020-02-12 NOTE — ED Provider Notes (Signed)
[facility] EMERGENCY DEPARTMENT Provider Note   CSN: [id number] Arrival date & time: 02/12/20  1948     History Chief Complaint  Patient presents with  . Abdominal Pain    AUDEL COAKLEY is a 24 y.o. male with a past medical history of asthma presenting with a 4-day history of flulike symptoms.  He states he developed fever with T-max of 101 3 days ago, then yesterday he developed nausea with emesis x1 but since then has had persistent watery brown diarrhea, stating urgency associated with periumbilical abdominal cramping which is improved after a bowel movement.  He has had multiple episodes of diarrhea since yesterday.  He has had no further emesis but does maintain nausea.  He has been able to maintain fluid intake but has had no solid food today.  He recently traveled to [place] for his honeymoon, no other travel.  He reports he had a negative Covid test this week.  He denies chest pain, shortness of breath, dysuria or hematuria.  He has had no hematemesis and has had no blood in his stools.  He has had no medications prior to arrival for symptom relief.  HPI     Past Medical History:  Diagnosis Date  . Asthma     Patient Active Problem List   Diagnosis Date Noted  . 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 No pertinent surgical history.     Family History  Problem Relation Age of Onset  . Heart disease Mother   . Cancer Mother     Social History   Tobacco Use  . Smoking status: Never Smoker  . Smokeless tobacco: Never Used  Substance Use Topics  . Alcohol use: Yes    Comment: occasionally  . Drug use: No    Home Medications Prior to Admission medications   Medication Sig Start Date End Date Taking? Authorizing Provider  acetaminophen (TYLENOL) 500 MG tablet Take 500 mg by mouth every 6 (six) hours as needed.   Yes [provider]  bismuth subsalicylate (PEPTO BISMOL) 262 MG chewable tablet Chew 524 mg by mouth as  needed for indigestion or diarrhea or loose stools.   Yes [provider]  ibuprofen (ADVIL) 200 MG tablet Take 200-800 mg by mouth every 6 (six) hours as needed for mild pain or moderate pain.   Yes [provider]  diphenoxylate-atropine (LOMOTIL) 2.5-0.025 MG tablet Take 1 tablet by mouth 4 (four) times daily as needed for diarrhea or loose stools. 02/12/20   Burgess Amor, PA-C  ibuprofen (ADVIL) 800 MG tablet Take 1 tablet (800 mg total) by mouth 3 (three) times daily. Patient not taking: Reported on 02/12/2020 11/29/19   Rennis Harding, PA-C    Allergies    Patient has no known allergies.  Review of Systems   Review of Systems  Constitutional: Positive for fever.  HENT: Negative for congestion and sore throat.   Eyes: Negative.   Respiratory: Negative for chest tightness and shortness of breath.   Cardiovascular: Negative for chest pain.  Gastrointestinal: Positive for abdominal pain, diarrhea, nausea and vomiting.  Genitourinary: Negative.   Musculoskeletal: Negative for arthralgias, joint swelling and neck pain.  Skin: Negative.  Negative for rash and wound.  Neurological: Negative for dizziness, weakness, light-headedness, numbness and headaches.  Psychiatric/Behavioral: Negative.     Physical Exam Updated Vital Signs BP 137/67   Pulse 77   Temp 99.6 F (37.6 C)   Resp 16   Ht 6'  2" (1.88 m)   Wt 113.4 kg   SpO2 100%   BMI 32.10 kg/m   Physical Exam Vitals and nursing note reviewed.  Constitutional:      Appearance: He is well-developed.  HENT:     Head: Normocephalic and atraumatic.  Eyes:     Conjunctiva/sclera: Conjunctivae normal.  Cardiovascular:     Rate and Rhythm: Normal rate and regular rhythm.     Heart sounds: Normal heart sounds.  Pulmonary:     Effort: Pulmonary effort is normal.     Breath sounds: Normal breath sounds. No wheezing.  Abdominal:     General: Bowel sounds are normal.     Palpations: Abdomen is soft.      Tenderness: There is abdominal tenderness in the epigastric area. There is no guarding or rebound. Negative signs include Murphy's sign, McBurney's sign and psoas sign.  Musculoskeletal:        General: Normal range of motion.     Cervical back: Normal range of motion.  Skin:    General: Skin is warm and dry.  Neurological:     General: No focal deficit present.     Mental Status: He is alert.     ED Results / Procedures / Treatments   Labs (all labs ordered are listed, but only abnormal results are displayed) Labs Reviewed  COMPREHENSIVE METABOLIC PANEL - Abnormal; Notable for the following components:      Result Value   Glucose, Bld 124 (*)    All other components within normal limits  LIPASE, BLOOD  CBC  URINALYSIS, ROUTINE W REFLEX MICROSCOPIC    EKG None  Radiology No results found.  Procedures Procedures (including critical care time)  Medications Ordered in ED Medications  diphenoxylate-atropine (LOMOTIL) 2.5-0.025 MG per tablet 2 tablet (has no administration in time range)    ED Course  I have reviewed the triage vital signs and the nursing notes.  Pertinent labs & imaging results that were available during my care of the patient were reviewed by me and considered in my medical decision making (see chart for details).    MDM Rules/Calculators/A&P                      Labs reviewed and discussed with patient.  He has no abnormal lab findings which are reassuring.  Specifically his BUN and creatinine are normal which suggest he has no significant dehydration.  His glucose is slightly elevated 124.  His white blood cell count is normal at 7.7.  Exam is reassuring, reexam prior to discharge reveals a nontender nonacute abdomen, there is no guarding.  I suspect he has a viral gastroenteritis.  He was given a dose of Lomotil here and a prescription for additional doses if his symptoms persist.  Patient initially had concerns over possible appendicitis.  However his  exam is completely benign, he has no reproducible pain.  However, return precautions were outlined for worsening symptoms or pain that migrates to the right lower quadrant.  I suspect this is a viral gastroenteritis. Final Clinical Impression(s) / ED Diagnoses Final diagnoses:  Gastroenteritis    Rx / DC Orders ED Discharge Orders         Ordered    diphenoxylate-atropine (LOMOTIL) 2.5-0.025 MG tablet  4 times daily PRN     02/12/20 2337           Evalee Jefferson, PA-C 02/12/20 2340    Fredia Sorrow, MD 02/16/20 [phone]

## 2020-03-15 DIAGNOSIS — L255 Unspecified contact dermatitis due to plants, except food: Secondary | ICD-10-CM | POA: Diagnosis not present

## 2020-03-15 DIAGNOSIS — Z6833 Body mass index (BMI) 33.0-33.9, adult: Secondary | ICD-10-CM | POA: Diagnosis not present

## 2020-03-15 DIAGNOSIS — Z1389 Encounter for screening for other disorder: Secondary | ICD-10-CM | POA: Diagnosis not present

## 2020-03-15 DIAGNOSIS — E6609 Other obesity due to excess calories: Secondary | ICD-10-CM | POA: Diagnosis not present

## 2020-11-05 ENCOUNTER — Other Ambulatory Visit: Payer: Self-pay

## 2020-11-05 ENCOUNTER — Encounter: Payer: Self-pay | Admitting: Emergency Medicine

## 2020-11-05 ENCOUNTER — Ambulatory Visit
Admission: EM | Admit: 2020-11-05 | Discharge: 2020-11-05 | Disposition: A | Discharge status: Home / Self Care | Payer: Commercial Managed Care - PPO | Attending: Emergency Medicine | Admitting: Emergency Medicine

## 2020-11-05 DIAGNOSIS — R509 Fever, unspecified: Secondary | ICD-10-CM | POA: Diagnosis not present

## 2020-11-05 DIAGNOSIS — R1084 Generalized abdominal pain: Secondary | ICD-10-CM

## 2020-11-05 DIAGNOSIS — R0981 Nasal congestion: Secondary | ICD-10-CM

## 2020-11-05 DIAGNOSIS — Z1152 Encounter for screening for COVID-19: Secondary | ICD-10-CM | POA: Diagnosis not present

## 2020-11-05 MED ORDER — ACETAMINOPHEN 325 MG PO TABS
650.0000 mg | ORAL_TABLET | Freq: Once | ORAL | Status: AC
  Administered 2020-11-05: 650 mg via ORAL

## 2020-11-05 MED ORDER — FLUTICASONE PROPIONATE 50 MCG/ACT NA SUSP: 1.0000 | Freq: Every day | NASAL | 0 refills | Status: DC

## 2020-11-05 MED ORDER — DEXAMETHASONE 4 MG PO TABS: 4.0000 mg | ORAL_TABLET | Freq: Every day | ORAL | 0 refills | Status: AC

## 2020-11-05 MED ORDER — CETIRIZINE HCL 10 MG PO TABS: 10.0000 mg | ORAL_TABLET | Freq: Every day | ORAL | 0 refills | Status: DC

## 2020-11-05 NOTE — ED Triage Notes (Signed)
Left lower ABD pain that started this morning.  Last bowel movement was a few minutes after pain started this morning.  Fever, body pain all over since this morning.  Feels tired.

## 2020-11-05 NOTE — Discharge Instructions (Signed)
COVID testing ordered.  It will take between 2-7 days for test results.  Someone will contact you regarding abnormal results.    Get plenty of rest and push fluids Zyrtec for nasal congestion, runny nose, and/or sore throat Flonase for nasal congestion and runny nose Decadron was prescribed Use medications daily for symptom relief Use OTC medications like ibuprofen or tylenol as needed fever or pain Call or go to the ED if you have any new or worsening symptoms such as fever, worsening cough, shortness of breath, chest tightness, chest pain, turning blue, changes in mental status, etc..Marland Kitchen

## 2020-11-05 NOTE — ED Provider Notes (Signed)
[facility]   [id number] 11/05/20 Arrival Time: 1638   CC: COVID symptoms  SUBJECTIVE: History from: patient.  Michael Kaufman is a 25 y.o. male who presented to the urgent care with a complaint of chills, fever, left lower abdominal pain that started today and nasal congestion for the past few days.  Denies sick exposure to COVID, flu or strep.  Denies recent travel.  Has tried OTC medication without relief.  Denies alleviating or aggravating factors.  Denies s previous symptoms in the past.   Denies, fatigue, sinus pain, rhinorrhea, sore throat, SOB, wheezing, chest pain, nausea, changes in bowel or bladder habits.      ROS: As per HPI.  All other pertinent ROS negative.     Past Medical History:  Diagnosis Date  . Asthma    History reviewed. No pertinent surgical history. No Known Allergies No current facility-administered medications on file prior to encounter.   Current Outpatient Medications on File Prior to Encounter  Medication Sig Dispense Refill  . acetaminophen (TYLENOL) 500 MG tablet Take 500 mg by mouth every 6 (six) hours as needed.    . bismuth subsalicylate (PEPTO BISMOL) 262 MG chewable tablet Chew 524 mg by mouth as needed for indigestion or diarrhea or loose stools.    . diphenoxylate-atropine (LOMOTIL) 2.5-0.025 MG tablet Take 1 tablet by mouth 4 (four) times daily as needed for diarrhea or loose stools. 30 tablet 0  . ibuprofen (ADVIL) 200 MG tablet Take 200-800 mg by mouth every 6 (six) hours as needed for mild pain or moderate pain.    Marland Kitchen ibuprofen (ADVIL) 800 MG tablet Take 1 tablet (800 mg total) by mouth 3 (three) times daily. (Patient not taking: Reported on 02/12/2020) 30 tablet 0   Social History   Socioeconomic History  . Marital status: Single    Spouse name: Not on file  . Number of children: Not on file  . Years of education: Not on file  . Highest education level: Not on file  Occupational History  . Not on file  Tobacco Use  .  Smoking status: Never Smoker  . Smokeless tobacco: Never Used  Substance and Sexual Activity  . Alcohol use: Yes    Comment: occasionally  . Drug use: No  . Sexual activity: Yes  Other Topics Concern  . Not on file  Social History Narrative  . Not on file   Social Determinants of Health   Financial Resource Strain: Not on file  Food Insecurity: Not on file  Transportation Needs: Not on file  Physical Activity: Not on file  Stress: Not on file  Social Connections: Not on file  Intimate Partner Violence: Not on file   Family History  Problem Relation Age of Onset  . Heart disease Mother   . Cancer Mother     OBJECTIVE:  Vitals:   11/05/20 1844 11/05/20 1854  BP: (!) 141/81   Pulse: (!) 103   Resp: 15   Temp: (!) 102.7 F (39.3 C)   SpO2: 97%   Weight:  255 lb (115.7 kg)  Height:  6\' 2"  (1.88 m)     General appearance: alert; appears fatigued, but nontoxic; speaking in full sentences and tolerating own secretions HEENT: NCAT; Ears: EACs clear, TMs pearly gray; Eyes: PERRL.  EOM grossly intact. Sinuses: nontender; Nose: nares patent without rhinorrhea, Throat: oropharynx clear, tonsils non erythematous or enlarged, uvula midline  Neck: supple without LAD Lungs: unlabored respirations, symmetrical air entry; cough: absent; no respiratory  distress; CTAB Heart: regular rate and rhythm.  Radial pulses 2+ symmetrical bilaterally Skin: warm and dry Psychological: alert and cooperative; normal mood and affect  LABS:  No results found for this or any previous visit (from the past 24 hour(s)).   ASSESSMENT & PLAN:  1. Encounter for screening for COVID-19   2. Generalized abdominal pain   3. Chills with fever   4. Nasal congestion     Meds ordered this encounter  Medications  . acetaminophen (TYLENOL) tablet 650 mg  . fluticasone (FLONASE) 50 MCG/ACT nasal spray    Sig: Place 1 spray into both nostrils daily for 14 days.    Dispense:  16 g    Refill:  0  .  dexamethasone (DECADRON) 4 MG tablet    Sig: Take 1 tablet (4 mg total) by mouth daily for 7 days.    Dispense:  7 tablet    Refill:  0  . cetirizine (ZYRTEC ALLERGY) 10 MG tablet    Sig: Take 1 tablet (10 mg total) by mouth daily.    Dispense:  30 tablet    Refill:  0    Discharge instructions  COVID testing ordered.  It will take between 2-7 days for test results.  Someone will contact you regarding abnormal results.    Get plenty of rest and push fluids Zyrtec for nasal congestion, runny nose, and/or sore throat Flonase for nasal congestion and runny nose Decadron was prescribed Use medications daily for symptom relief Use OTC medications like ibuprofen or tylenol as needed fever or pain Call or go to the ED if you have any new or worsening symptoms such as fever, worsening cough, shortness of breath, chest tightness, chest pain, turning blue, changes in mental status, etc...   Reviewed expectations re: course of current medical issues. Questions answered. Outlined signs and symptoms indicating need for more acute intervention. Patient verbalized understanding. After Visit Summary given.         Durward Parcel, FNP 11/05/20 1929

## 2020-11-08 LAB — COVID-19, FLU A+B NAA
Influenza A, NAA: NOT DETECTED
Influenza B, NAA: NOT DETECTED
SARS-CoV-2, NAA: DETECTED — AB

## 2021-02-09 ENCOUNTER — Ambulatory Visit
Admission: EM | Admit: 2021-02-09 | Discharge: 2021-02-09 | Disposition: A | Discharge status: Home / Self Care | Payer: Commercial Managed Care - PPO | Attending: Emergency Medicine | Admitting: Emergency Medicine

## 2021-02-09 ENCOUNTER — Encounter: Payer: Self-pay | Admitting: Emergency Medicine

## 2021-02-09 ENCOUNTER — Other Ambulatory Visit: Payer: Self-pay

## 2021-02-09 DIAGNOSIS — K219 Gastro-esophageal reflux disease without esophagitis: Secondary | ICD-10-CM

## 2021-02-09 MED ORDER — OMEPRAZOLE 20 MG PO CPDR: 20.0000 mg | DELAYED_RELEASE_CAPSULE | Freq: Every day | ORAL | 0 refills | Status: DC

## 2021-02-09 MED ORDER — ALUM & MAG HYDROXIDE-SIMETH 200-200-20 MG/5ML PO SUSP
30.0000 mL | Freq: Once | ORAL | Status: AC
  Administered 2021-02-09: 30 mL via ORAL

## 2021-02-09 MED ORDER — LIDOCAINE VISCOUS HCL 2 % MT SOLN
15.0000 mL | Freq: Once | OROMUCOSAL | Status: AC
  Administered 2021-02-09: 15 mL via ORAL

## 2021-02-09 NOTE — ED Provider Notes (Signed)
[facility]   [id number] 02/09/21 Arrival Time: 1225  CC: Acid reflux  SUBJECTIVE:  Michael Kaufman is a 25 y.o. male who presents with complaint of acid reflux that began this morning.  Describes as burning in esophagus.  Denies a precipitating event, or eating anything unusual.  Denies abdominal or flank pain.  Has tried OTC medications.  Denies alleviating or aggravating factors.  Reports similar symptoms in the past.  Reports nausea, and vomiting.    Denies fever, chills, chest pain, SOB, diarrhea, constipation, hematochezia, melena, dysuria, difficulty urinating, increased frequency or urgency, flank pain, loss of bowel or bladder function.   No LMP for male patient.  ROS: As per HPI.  All other pertinent ROS negative.     Past Medical History:  Diagnosis Date  . Asthma    History reviewed. No pertinent surgical history. Allergies  Allergen Reactions  . Menthol     Skin turns red and burns    No current facility-administered medications on file prior to encounter.   Current Outpatient Medications on File Prior to Encounter  Medication Sig Dispense Refill  . acetaminophen (TYLENOL) 500 MG tablet Take 500 mg by mouth every 6 (six) hours as needed.    . bismuth subsalicylate (PEPTO BISMOL) 262 MG chewable tablet Chew 524 mg by mouth as needed for indigestion or diarrhea or loose stools.    . cetirizine (ZYRTEC ALLERGY) 10 MG tablet Take 1 tablet (10 mg total) by mouth daily. 30 tablet 0  . diphenoxylate-atropine (LOMOTIL) 2.5-0.025 MG tablet Take 1 tablet by mouth 4 (four) times daily as needed for diarrhea or loose stools. 30 tablet 0  . fluticasone (FLONASE) 50 MCG/ACT nasal spray Place 1 spray into both nostrils daily for 14 days. 16 g 0  . ibuprofen (ADVIL) 200 MG tablet Take 200-800 mg by mouth every 6 (six) hours as needed for mild pain or moderate pain.    Marland Kitchen ibuprofen (ADVIL) 800 MG tablet Take 1 tablet (800 mg total) by mouth 3 (three) times daily. (Patient not  taking: Reported on 02/12/2020) 30 tablet 0   Social History   Socioeconomic History  . Marital status: Single    Spouse name: Not on file  . Number of children: Not on file  . Years of education: Not on file  . Highest education level: Not on file  Occupational History  . Not on file  Tobacco Use  . Smoking status: Never Smoker  . Smokeless tobacco: Never Used  Substance and Sexual Activity  . Alcohol use: Yes    Comment: occasionally  . Drug use: No  . Sexual activity: Yes  Other Topics Concern  . Not on file  Social History Narrative  . Not on file   Social Determinants of Health   Financial Resource Strain: Not on file  Food Insecurity: Not on file  Transportation Needs: Not on file  Physical Activity: Not on file  Stress: Not on file  Social Connections: Not on file  Intimate Partner Violence: Not on file   Family History  Problem Relation Age of Onset  . Heart disease Mother   . Cancer Mother      OBJECTIVE:  Vitals:   02/09/21 1314  BP: 126/76  Pulse: (!) 57  Resp: 18  Temp: 98.4 F (36.9 C)  TempSrc: Oral  SpO2: 98%    General appearance: Alert; NAD HEENT: NCAT.  Oropharynx clear.  Lungs: clear to auscultation bilaterally without adventitious breath sounds Heart: regular rate and  rhythm.   Abdomen: soft, non-distended; normal active bowel sounds; non-tender to light and deep palpation; nontender at McBurney's point; negative Murphy's sign; no guarding Extremities: no edema; symmetrical with no gross deformities Skin: warm and dry Neurologic: normal gait Psychological: alert and cooperative; normal mood and affect  ASSESSMENT & PLAN:  1. Gastroesophageal reflux disease, unspecified whether esophagitis present     Meds ordered this encounter  Medications  . AND Linked Order Group   . alum & mag hydroxide-simeth (MAALOX/MYLANTA) 200-200-20 MG/5ML suspension 30 mL   . lidocaine (XYLOCAINE) 2 % viscous mouth solution 15 mL    Reports  significant improvement in symptoms with GI cocktail Omeprazole prescribed take daily Avoid eating 2-3 hours before bed Elevate head of bed.  Avoid chocolate, caffeine, alcohol, onion, and mint prior to bed.  This relaxes the bottom part of your esophagus and can make your symptoms worse.  Follow up with PCP if symptoms persists Return or go to the ED if you have any new or worsening symptoms fever, chills, nausea, vomiting, abdominal pain, flank pain, changes in bowel/ bladder changes, etc...  Reviewed expectations re: course of current medical issues. Questions answered. Outlined signs and symptoms indicating need for more acute intervention. Patient verbalized understanding. After Visit Summary given.   Rennis Harding, PA-C 02/09/21 1419

## 2021-02-09 NOTE — ED Triage Notes (Addendum)
Pt reports acid reflux that feels like acid is shooting up into his esophagus that started last night.  Has tried tums with no relief.    Pt needs note for work.

## 2021-02-09 NOTE — Discharge Instructions (Signed)
Reports significant improvement in symptoms with GI cocktail Omeprazole prescribed take daily Avoid eating 2-3 hours before bed Elevate head of bed.  Avoid chocolate, caffeine, alcohol, onion, and mint prior to bed.  This relaxes the bottom part of your esophagus and can make your symptoms worse.  Follow up with PCP if symptoms persists Return or go to the ED if you have any new or worsening symptoms fever, chills, nausea, vomiting, abdominal pain, flank pain, changes in bowel/ bladder changes, etc..Marland Kitchen

## 2022-05-01 ENCOUNTER — Encounter (HOSPITAL_COMMUNITY): Payer: Self-pay | Admitting: Emergency Medicine

## 2022-05-01 ENCOUNTER — Emergency Department (HOSPITAL_COMMUNITY)
Admission: EM | Admit: 2022-05-01 | Discharge: 2022-05-02 | Disposition: A | Discharge status: Home / Self Care | Payer: Commercial Managed Care - PPO | Attending: Emergency Medicine | Admitting: Emergency Medicine

## 2022-05-01 ENCOUNTER — Other Ambulatory Visit: Payer: Self-pay

## 2022-05-01 DIAGNOSIS — L739 Follicular disorder, unspecified: Secondary | ICD-10-CM

## 2022-05-01 DIAGNOSIS — H9201 Otalgia, right ear: Secondary | ICD-10-CM | POA: Diagnosis present

## 2022-05-01 DIAGNOSIS — J45909 Unspecified asthma, uncomplicated: Secondary | ICD-10-CM | POA: Insufficient documentation

## 2022-05-01 NOTE — ED Triage Notes (Signed)
Right sided ear pain x 5 hours, pain in jaw and hurts to open mouth.

## 2022-05-02 MED ORDER — CEPHALEXIN 500 MG PO CAPS
500.0000 mg | ORAL_CAPSULE | Freq: Once | ORAL | Status: AC
  Administered 2022-05-02: 500 mg via ORAL
  Filled 2022-05-02: qty 1

## 2022-05-02 MED ORDER — NAPROXEN 500 MG PO TABS: 500.0000 mg | ORAL_TABLET | Freq: Two times a day (BID) | ORAL | 0 refills | Status: DC

## 2022-05-02 MED ORDER — CEPHALEXIN 500 MG PO CAPS: 500.0000 mg | ORAL_CAPSULE | Freq: Three times a day (TID) | ORAL | 0 refills | Status: AC

## 2022-05-02 MED ORDER — NAPROXEN 250 MG PO TABS
500.0000 mg | ORAL_TABLET | Freq: Once | ORAL | Status: AC
  Administered 2022-05-02: 500 mg via ORAL
  Filled 2022-05-02: qty 2

## 2022-05-02 NOTE — ED Provider Notes (Signed)
[facility]-EMERGENCY DEPT [facility] Emergency Department Provider Note MRN:  [id number]  Arrival date & time: 05/02/22     Chief Complaint   Otalgia   History of Present Illness   Michael Kaufman is a 26 y.o. year-old male with no pertinent past medical presenting to the ED with chief complaint of otalgia.  Pain to the right ear.  Seem to be related to a pimple, which his wife was able to push on and drain earlier today.  Swelling seems better but still tender.  No fever, no other complaints.  Review of Systems  A thorough review of systems was obtained and all systems are negative except as noted in the HPI and PMH.   Patient's Health History    Past Medical History:  Diagnosis Date   Asthma     History reviewed. No pertinent surgical history.  Family History  Problem Relation Age of Onset   Heart disease Mother    Cancer Mother     Social History   Socioeconomic History   Marital status: Single    Spouse name: Not on file   Number of children: Not on file   Years of education: Not on file   Highest education level: Not on file  Occupational History   Not on file  Tobacco Use   Smoking status: Never   Smokeless tobacco: Never  Substance and Sexual Activity   Alcohol use: Not Currently    Comment: occasionally   Drug use: No   Sexual activity: Yes  Other Topics Concern   Not on file  Social History Narrative   Not on file   Social Determinants of Health   Financial Resource Strain: Not on file  Food Insecurity: Not on file  Transportation Needs: Not on file  Physical Activity: Not on file  Stress: Not on file  Social Connections: Not on file  Intimate Partner Violence: Not on file     Physical Exam   Vitals:   05/01/22 2333 05/02/22 0000  BP: 131/65 114/68  Pulse: 68 (!) 57  Resp: 16 19  Temp:    SpO2: 98% 97%    CONSTITUTIONAL: Well-appearing, NAD NEURO/PSYCH:  Alert and oriented x 3, no focal deficits EYES:  eyes equal and  reactive ENT/NECK:  no LAD, no JVD CARDIO: Regular rate, well-perfused, normal S1 and S2 PULM:  CTAB no wheezing or rhonchi GI/GU:  non-distended, non-tender MSK/SPINE:  No gross deformities, no edema SKIN:  no rash, atraumatic   *Additional and/or pertinent findings included in MDM below  Diagnostic and Interventional Summary    EKG Interpretation  Date/Time:    Ventricular Rate:    PR Interval:    QRS Duration:   QT Interval:    QTC Calculation:   R Axis:     Text Interpretation:         Labs Reviewed - No data to display  No orders to display    Medications  cephALEXin (KEFLEX) capsule 500 mg (has no administration in time range)  naproxen (NAPROSYN) tablet 500 mg (has no administration in time range)     Procedures  /  Critical Care Procedures  ED Course and Medical Decision Making  Initial Impression and Ddx Pain and tenderness to the right tragus, the external canal is not edematous, the TM appears largely normal, suspect folliculitis or soft tissue infection, no fluctuance, doubt any significant retained abscess, doubt need for drainage at this time.  Appropriate for discharge.  Past medical/surgical history that increases  complexity of ED encounter: None  Interpretation of Diagnostics Laboratory and/or imaging options to aid in the diagnosis/care of the patient were considered.  After careful history and physical examination, it was determined that there was no indication for diagnostics at this time.  Patient Reassessment and Ultimate Disposition/Management     Discharge  Patient management required discussion with the following services or consulting groups:  None  Complexity of Problems Addressed Acute complicated illness or Injury  Additional Data Reviewed and Analyzed Further history obtained from: None  Additional Factors Impacting ED Encounter Risk Prescriptions  Elmer Sow. Pilar Plate, MD [facility] Health Emergency Medicine [facility]  Health [email or web address]  Final Clinical Impressions(s) / ED Diagnoses     ICD-10-CM   1. Folliculitis  L73.9       ED Discharge Orders          Ordered    cephALEXin (KEFLEX) 500 MG capsule  3 times daily        05/02/22 0020    naproxen (NAPROSYN) 500 MG tablet  2 times daily        05/02/22 0020             Discharge Instructions Discussed with and Provided to Patient:    Discharge Instructions      You were evaluated in the Emergency Department and after careful evaluation, we did not find any emergent condition requiring admission or further testing in the hospital.  Your exam/testing today is overall reassuring.  Symptoms seem to be due to a folliculitis or soft tissue infection of the ear.  Take the Keflex antibiotic as directed.  Use the Naprosyn anti-inflammatory twice daily for pain as needed.  Please return to the Emergency Department if you experience any worsening of your condition.   Thank you for allowing [place] to be a part of your care.      Sabas Sous, MD 05/02/22 [phone]

## 2022-05-02 NOTE — Discharge Instructions (Signed)
You were evaluated in the Emergency Department and after careful evaluation, we did not find any emergent condition requiring admission or further testing in the hospital.  Your exam/testing today is overall reassuring.  Symptoms seem to be due to a folliculitis or soft tissue infection of the ear.  Take the Keflex antibiotic as directed.  Use the Naprosyn anti-inflammatory twice daily for pain as needed.  Please return to the Emergency Department if you experience any worsening of your condition.   Thank you for allowing [place] to be a part of your care.

## 2024-01-19 ENCOUNTER — Ambulatory Visit (INDEPENDENT_AMBULATORY_CARE_PROVIDER_SITE_OTHER)

## 2024-01-19 ENCOUNTER — Ambulatory Visit (HOSPITAL_COMMUNITY): Admission: EM | Admit: 2024-01-19 | Discharge: 2024-01-19 | Disposition: A | Discharge status: Home / Self Care

## 2024-01-19 ENCOUNTER — Encounter (HOSPITAL_COMMUNITY): Payer: Self-pay

## 2024-01-19 DIAGNOSIS — S93492A Sprain of other ligament of left ankle, initial encounter: Secondary | ICD-10-CM

## 2024-01-19 DIAGNOSIS — S82892A Other fracture of left lower leg, initial encounter for closed fracture: Secondary | ICD-10-CM

## 2024-01-19 MED ORDER — IBUPROFEN 800 MG PO TABS: 800.0000 mg | ORAL_TABLET | Freq: Three times a day (TID) | ORAL | 0 refills | Status: AC

## 2024-01-19 MED ORDER — IBUPROFEN 800 MG PO TABS
800.0000 mg | ORAL_TABLET | Freq: Once | ORAL | Status: AC
  Administered 2024-01-19: 800 mg via ORAL

## 2024-01-19 MED ORDER — IBUPROFEN 800 MG PO TABS
ORAL_TABLET | ORAL | Status: AC
  Filled 2024-01-19: qty 1

## 2024-01-19 NOTE — ED Provider Notes (Signed)
 [facility]  Note:  This document was prepared using Dragon voice recognition software and may include unintentional dictation errors.  MRN: [id number] DOB: December 03, 1995  Subjective:   Michael Kaufman is a 28 y.o. male presenting for left ankle pain and swelling since 4 hours ago.  Patient reports that he fell twisting his ankle.  Reports that he heard a pop when he twisted his ankle.  Denies any past trauma or serious injury to ankle.  Patient denies taking any over-the-counter medication to treat symptoms.  Patient concern for ankle fracture.  No current facility-administered medications for this encounter.  Current Outpatient Medications:    ibuprofen (ADVIL) 800 MG tablet, Take 1 tablet (800 mg total) by mouth 3 (three) times daily., Disp: 30 tablet, Rfl: 0   acetaminophen (TYLENOL) 500 MG tablet, Take 500 mg by mouth every 6 (six) hours as needed., Disp: , Rfl:    Allergies  Allergen Reactions   Menthol     Skin turns red and burns     Past Medical History:  Diagnosis Date   Asthma      History reviewed. No pertinent surgical history.  Family History  Problem Relation Age of Onset   Heart disease Mother    Cancer Mother     Social History   Tobacco Use   Smoking status: Never   Smokeless tobacco: Never  Substance Use Topics   Alcohol use: Not Currently    Comment: occasionally   Drug use: No    ROS Refer to HPI for ROS details.  Objective:   Vitals: BP (!) 144/86 (BP Location: Right Arm)   Pulse 74   Temp 98.6 F (37 C) (Oral)   Resp 16   Ht 6\' 2"  (1.88 m)   Wt 265 lb (120.2 kg)   SpO2 99%   BMI 34.02 kg/m   Physical Exam Vitals and nursing note reviewed.  Constitutional:      General: He is not in acute distress.    Appearance: Normal appearance. He is well-developed. He is not ill-appearing or toxic-appearing.  HENT:     Head: Normocephalic.  Cardiovascular:     Rate and Rhythm: Normal rate.  Pulmonary:     Effort: Pulmonary  effort is normal. No respiratory distress.  Musculoskeletal:     Right ankle: Normal.     Left ankle: Swelling present. No deformity or ecchymosis. Tenderness present over the lateral malleolus. Decreased range of motion. Normal pulse.     Left Achilles Tendon: No tenderness or defects.  Skin:    General: Skin is warm and dry.     Capillary Refill: Capillary refill takes less than 2 seconds.  Neurological:     General: No focal deficit present.     Mental Status: He is alert and oriented to person, place, and time.  Psychiatric:        Mood and Affect: Mood normal.     Procedures  No results found for this or any previous visit (from the past 24 hours).  Assessment and Plan :   PDMP not reviewed this encounter.  1. Closed fracture of left ankle, initial encounter    - DG Ankle Complete Left x-ray shows possible talonavicular avulsion fracture.  Recommend clinical correlation and follow-up with Ortho for reevaluation. - ibuprofen (ADVIL) tablet 800 mg given in UC for acute ankle pain secondary to ankle fracture - ibuprofen (ADVIL) 800 MG tablet; Take 1 tablet (800 mg total) by mouth 3 (three) times daily.  Dispense:  30 tablet; Refill: 0 - Apply ace wrap in UC for immobilization and compression - Crutches given in UC for nonweightbearing until ankle heals. -Walking boot applied in urgent care for immobilization and protection until follow-up with orthopedics -Continue to monitor symptoms for any change in severity if there is any escalation of current symptoms or development of new symptoms follow-up in ER for further evaluation and management.  [place]   [place], Lannon B, NP 01/19/24 1800

## 2024-01-19 NOTE — ED Triage Notes (Signed)
 Patient here today with c/o left ankle pain after falling today at 4 pm. Patient rolled his ankle and felt hit ankle crack.

## 2024-01-19 NOTE — Discharge Instructions (Addendum)
 1. Sprain of anterior talofibular ligament of left ankle, initial encounter (Primary) - DG Ankle Complete Left x-ray shows possible talonavicular avulsion fracture.  Recommend clinical correlation and follow-up with Ortho for reevaluation. - ibuprofen (ADVIL) tablet 800 mg given in UC for acute ankle pain secondary to ankle fracture - ibuprofen (ADVIL) 800 MG tablet; Take 1 tablet (800 mg total) by mouth 3 (three) times daily.  Dispense: 30 tablet; Refill: 0 - Apply ace wrap in UC for immobilization and compression - Crutches given in UC for nonweightbearing until ankle heals. -Walking boot applied in urgent care for immobilization and protection until follow-up with orthopedics
